# Patient Record
Sex: Female | Born: 1955 | Race: Black or African American | Hispanic: No | Marital: Single | Smoking: Never smoker
Health system: Southern US, Community
[De-identification: ages and names within clinical notes are randomized; demographics above are authoritative.]

## PROBLEM LIST (undated history)

## (undated) DIAGNOSIS — I10 Essential (primary) hypertension: Secondary | ICD-10-CM

## (undated) DIAGNOSIS — E039 Hypothyroidism, unspecified: Secondary | ICD-10-CM

## (undated) HISTORY — PX: ABDOMINAL HYSTERECTOMY: SHX81

---

## 2011-11-17 ENCOUNTER — Ambulatory Visit: Payer: Self-pay | Admitting: Family Medicine

## 2014-06-25 ENCOUNTER — Ambulatory Visit: Payer: Self-pay

## 2014-07-29 ENCOUNTER — Ambulatory Visit: Payer: Self-pay

## 2016-01-06 ENCOUNTER — Ambulatory Visit: Payer: Self-pay | Attending: Oncology

## 2017-10-16 ENCOUNTER — Other Ambulatory Visit: Payer: Self-pay | Admitting: Family Medicine

## 2017-10-16 DIAGNOSIS — Z1231 Encounter for screening mammogram for malignant neoplasm of breast: Secondary | ICD-10-CM

## 2017-12-02 ENCOUNTER — Ambulatory Visit
Admission: EM | Admit: 2017-12-02 | Discharge: 2017-12-02 | Disposition: A | Discharge status: Home / Self Care | Payer: BC Managed Care – PPO | Attending: Emergency Medicine | Admitting: Emergency Medicine

## 2017-12-02 ENCOUNTER — Other Ambulatory Visit: Payer: Self-pay

## 2017-12-02 DIAGNOSIS — S7012XA Contusion of left thigh, initial encounter: Secondary | ICD-10-CM | POA: Diagnosis not present

## 2017-12-02 HISTORY — DX: Essential (primary) hypertension: I10

## 2017-12-02 HISTORY — DX: Hypothyroidism, unspecified: E03.9

## 2017-12-02 MED ORDER — IBUPROFEN 600 MG PO TABS: 600.0000 mg | ORAL_TABLET | Freq: Four times a day (QID) | ORAL | 0 refills | Status: AC | PRN

## 2017-12-02 MED ORDER — PREDNISONE 10 MG (21) PO TBPK: ORAL_TABLET | ORAL | 0 refills | Status: AC
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
## 2017-12-02 NOTE — Discharge Instructions (Signed)
prednisone 6-day taper, ibuprofen 600 mg take with 1 g of Tylenol 2-4 times a day as needed for pain, ice for 20 minutes at a time.
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
## 2018-04-13 ENCOUNTER — Other Ambulatory Visit: Payer: Self-pay | Admitting: Family Medicine

## 2018-04-13 DIAGNOSIS — R2241 Localized swelling, mass and lump, right lower limb: Secondary | ICD-10-CM

## 2018-04-20 ENCOUNTER — Encounter
Admission: RE | Admit: 2018-04-20 | Discharge: 2018-04-20 | Disposition: A | Discharge status: Home / Self Care | Payer: BC Managed Care – PPO | Source: Ambulatory Visit | Attending: Family Medicine | Admitting: Family Medicine

## 2018-04-20 DIAGNOSIS — R2241 Localized swelling, mass and lump, right lower limb: Secondary | ICD-10-CM | POA: Insufficient documentation

## 2018-07-02 ENCOUNTER — Ambulatory Visit
Admission: RE | Admit: 2018-07-02 | Discharge: 2018-07-02 | Disposition: A | Discharge status: Home / Self Care | Payer: BC Managed Care – PPO | Source: Ambulatory Visit | Attending: Family Medicine | Admitting: Family Medicine

## 2018-07-02 DIAGNOSIS — Z1231 Encounter for screening mammogram for malignant neoplasm of breast: Secondary | ICD-10-CM | POA: Insufficient documentation

## 2019-03-29 ENCOUNTER — Other Ambulatory Visit: Payer: Self-pay

## 2019-03-29 DIAGNOSIS — Z20822 Contact with and (suspected) exposure to covid-19: Secondary | ICD-10-CM

## 2019-03-30 LAB — NOVEL CORONAVIRUS, NAA: SARS-CoV-2, NAA: NOT DETECTED

## 2019-04-05 ENCOUNTER — Other Ambulatory Visit: Payer: Self-pay | Admitting: Family Medicine

## 2019-04-05 DIAGNOSIS — Z20822 Contact with and (suspected) exposure to covid-19: Secondary | ICD-10-CM

## 2019-04-07 LAB — NOVEL CORONAVIRUS, NAA: SARS-CoV-2, NAA: NOT DETECTED

## 2019-04-07 NOTE — Telephone Encounter (Signed)
This encounter was created in error - please disregard.

## 2020-08-03 ENCOUNTER — Other Ambulatory Visit: Payer: Self-pay | Admitting: Family Medicine

## 2020-08-03 DIAGNOSIS — Z1231 Encounter for screening mammogram for malignant neoplasm of breast: Secondary | ICD-10-CM

## 2020-11-04 ENCOUNTER — Ambulatory Visit: Payer: BC Managed Care – PPO

## 2020-11-05 ENCOUNTER — Other Ambulatory Visit: Payer: Self-pay

## 2020-11-05 ENCOUNTER — Ambulatory Visit
Admission: RE | Admit: 2020-11-05 | Discharge: 2020-11-05 | Disposition: A | Discharge status: Home / Self Care | Payer: BC Managed Care – PPO | Source: Ambulatory Visit | Attending: Family Medicine | Admitting: Family Medicine

## 2020-11-05 DIAGNOSIS — Z1231 Encounter for screening mammogram for malignant neoplasm of breast: Secondary | ICD-10-CM | POA: Diagnosis not present

## 2021-10-15 ENCOUNTER — Other Ambulatory Visit: Payer: Self-pay | Admitting: Family Medicine

## 2021-10-15 DIAGNOSIS — Z1231 Encounter for screening mammogram for malignant neoplasm of breast: Secondary | ICD-10-CM

## 2021-11-26 ENCOUNTER — Ambulatory Visit
Admission: RE | Admit: 2021-11-26 | Discharge: 2021-11-26 | Disposition: A | Discharge status: Home / Self Care | Payer: BC Managed Care – PPO | Source: Ambulatory Visit | Attending: Family Medicine | Admitting: Family Medicine

## 2021-11-26 ENCOUNTER — Other Ambulatory Visit: Payer: Self-pay

## 2021-11-26 DIAGNOSIS — Z1231 Encounter for screening mammogram for malignant neoplasm of breast: Secondary | ICD-10-CM | POA: Diagnosis present

## 2022-03-11 ENCOUNTER — Encounter: Payer: BC Managed Care – PPO | Admitting: Obstetrics and Gynecology

## 2022-05-20 ENCOUNTER — Encounter: Payer: BC Managed Care – PPO | Admitting: Obstetrics and Gynecology

## 2022-11-29 ENCOUNTER — Other Ambulatory Visit: Payer: Self-pay | Admitting: Family Medicine

## 2022-11-29 DIAGNOSIS — Z1231 Encounter for screening mammogram for malignant neoplasm of breast: Secondary | ICD-10-CM

## 2023-02-08 ENCOUNTER — Ambulatory Visit: Payer: BC Managed Care – PPO

## 2023-02-08 ENCOUNTER — Ambulatory Visit
Admission: RE | Admit: 2023-02-08 | Discharge: 2023-02-08 | Disposition: A | Discharge status: Home / Self Care | Payer: BC Managed Care – PPO | Source: Ambulatory Visit | Attending: Family Medicine | Admitting: Family Medicine

## 2023-02-08 DIAGNOSIS — Z1231 Encounter for screening mammogram for malignant neoplasm of breast: Secondary | ICD-10-CM

## 2023-11-17 ENCOUNTER — Other Ambulatory Visit: Payer: Self-pay | Admitting: Nurse Practitioner

## 2023-11-17 DIAGNOSIS — Z780 Asymptomatic menopausal state: Secondary | ICD-10-CM

## 2024-02-16 IMAGING — MG MM DIGITAL SCREENING BILAT W/ TOMO AND CAD
6 of 10 series · 6 of 30 positions shown · non-contrast
Comparison: Previous exam(s).

CLINICAL DATA: Screening.

EXAM:
DIGITAL SCREENING BILATERAL MAMMOGRAM WITH TOMOSYNTHESIS AND CAD
TECHNIQUE: Bilateral screening digital craniocaudal and mediolateral oblique
mammograms were obtained. Bilateral screening digital breast
tomosynthesis was performed. The images were evaluated with
computer-aided detection.

[R CC synth-2D (1 of 2)]
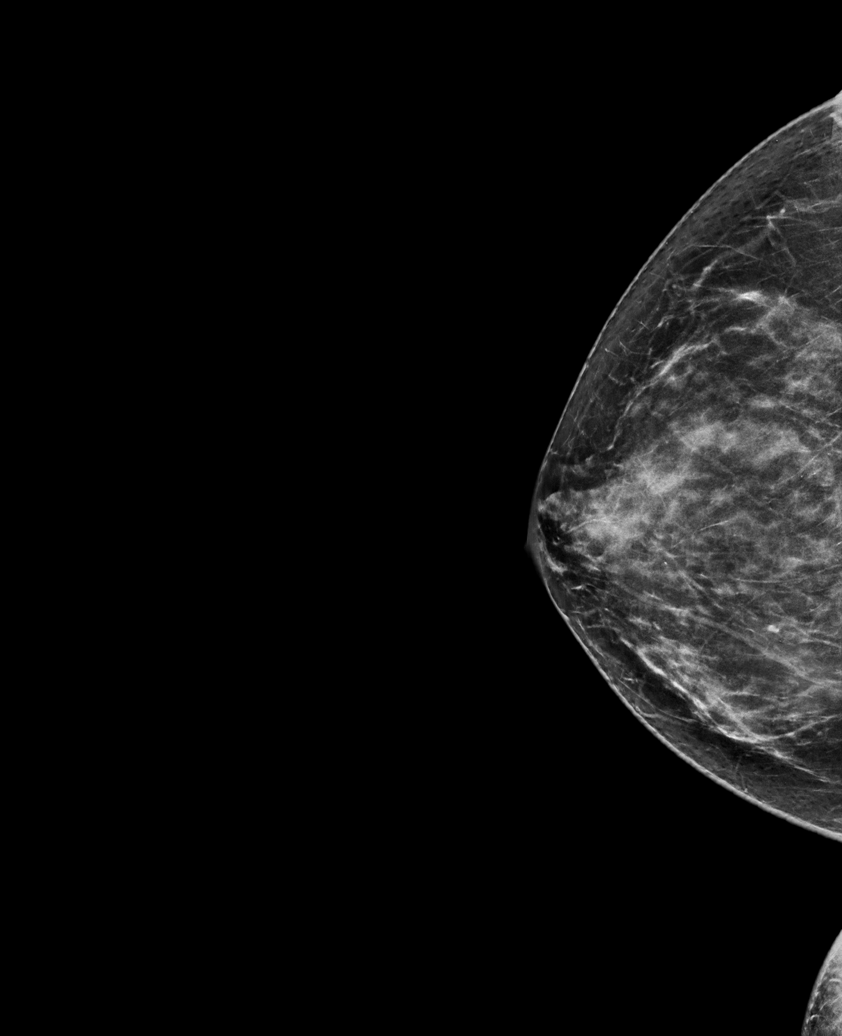

[L MLO synth-2D]
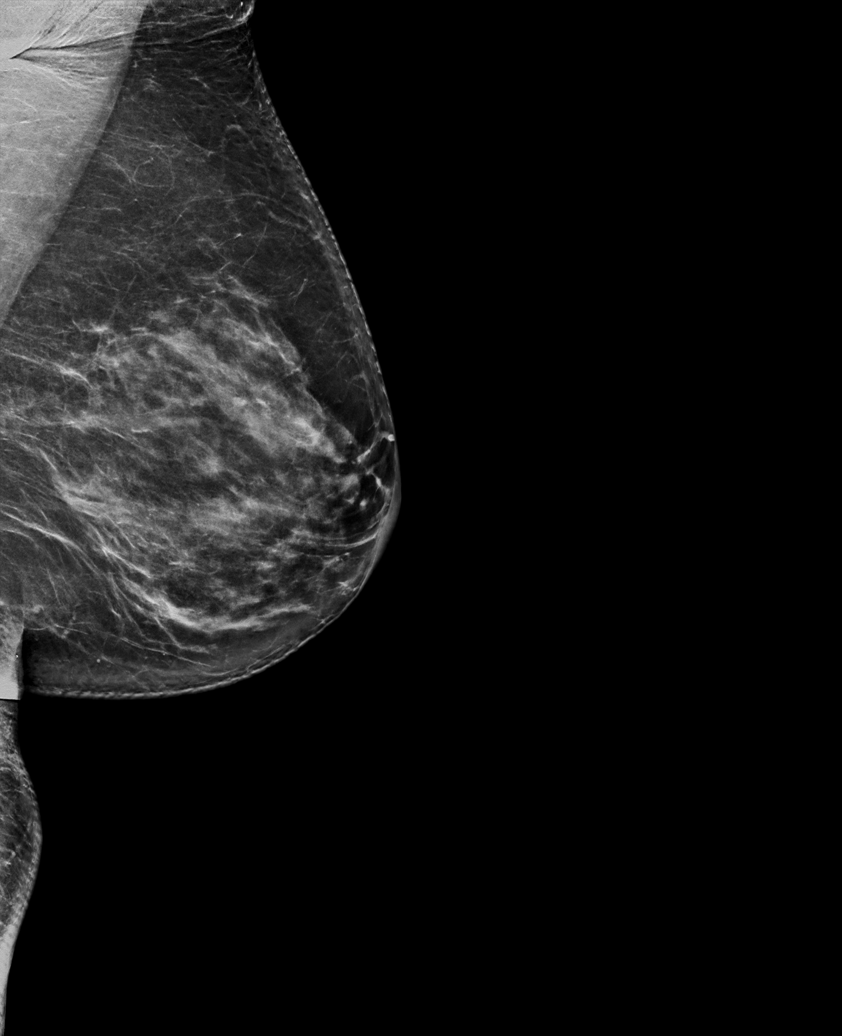

[L CC synth-2D]
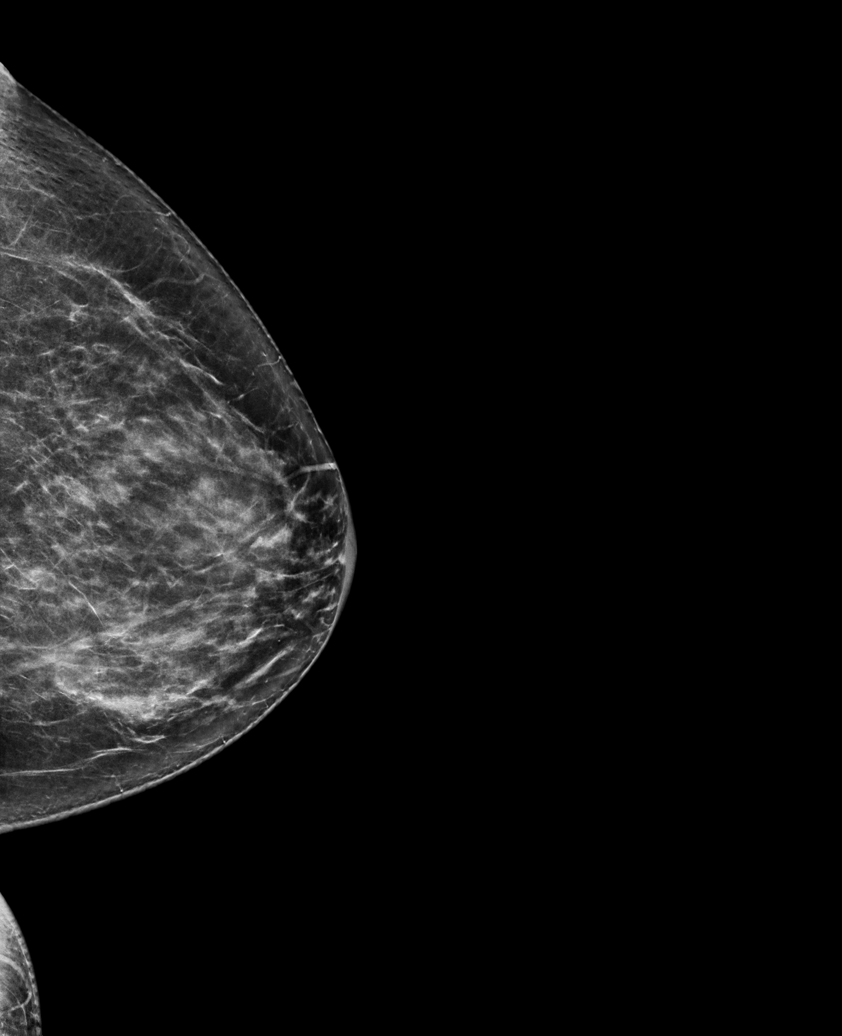

[R CC synth-2D (2 of 2)]
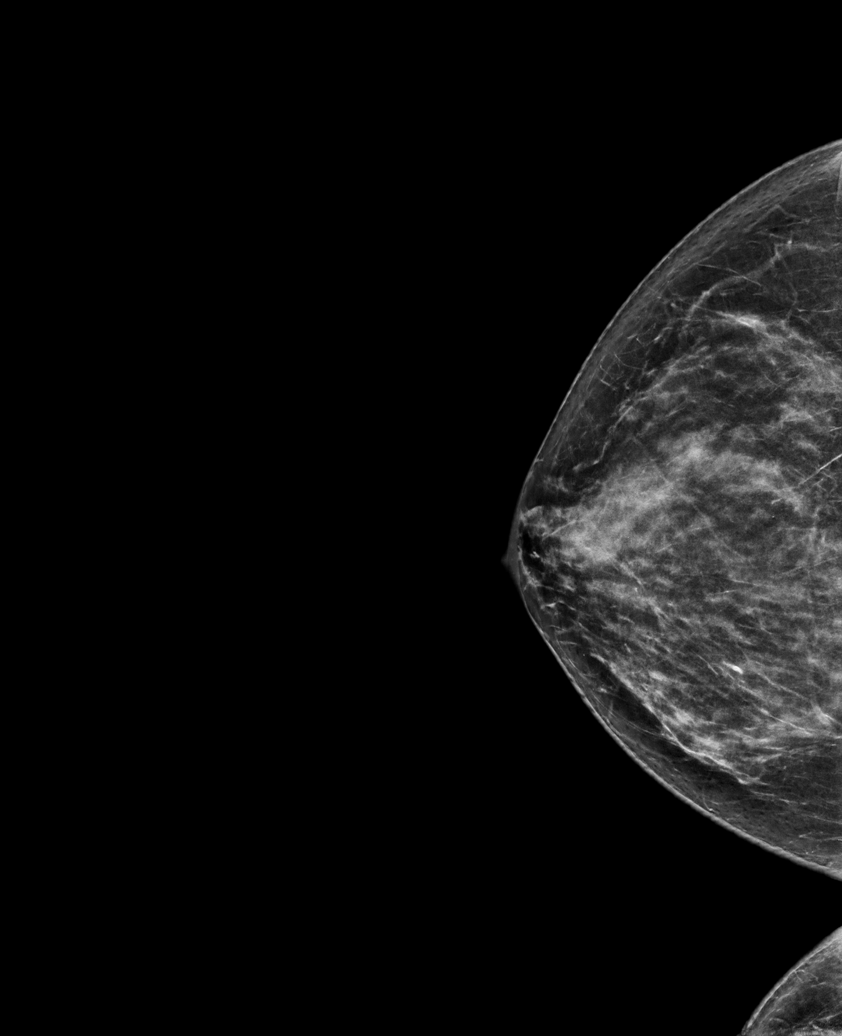

[R MLO synth-2D]
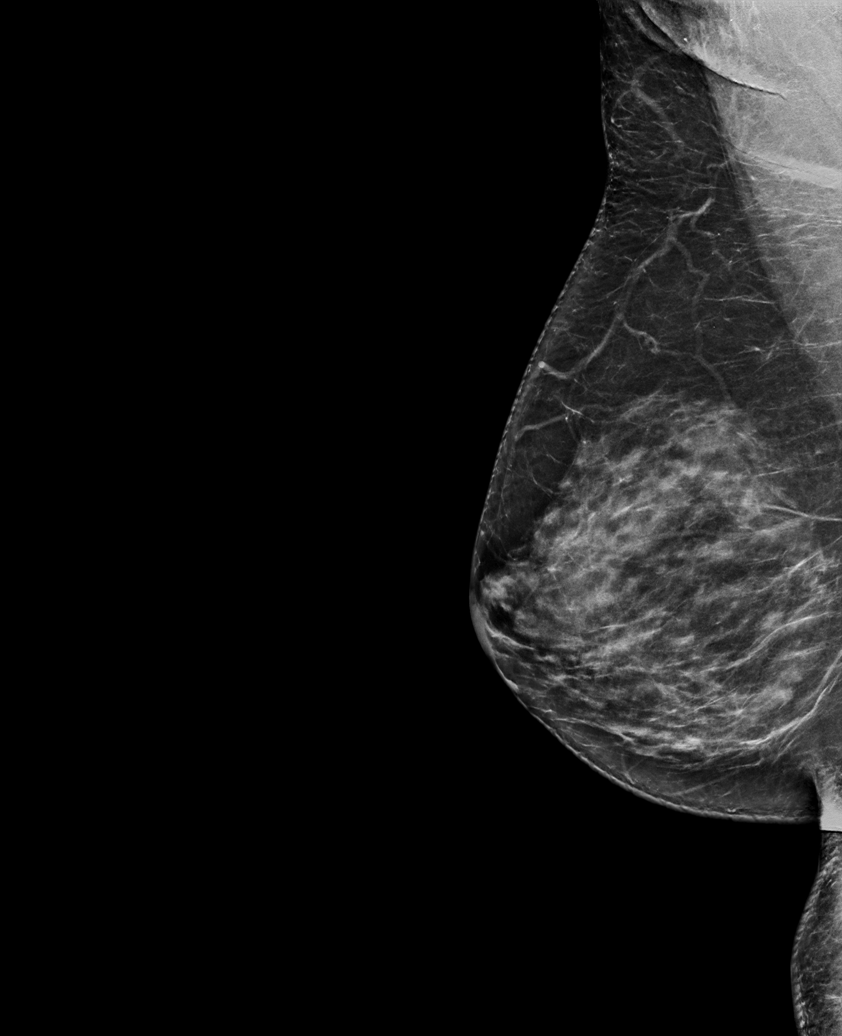

[L CC tomo · tomo slice 37/72.0]
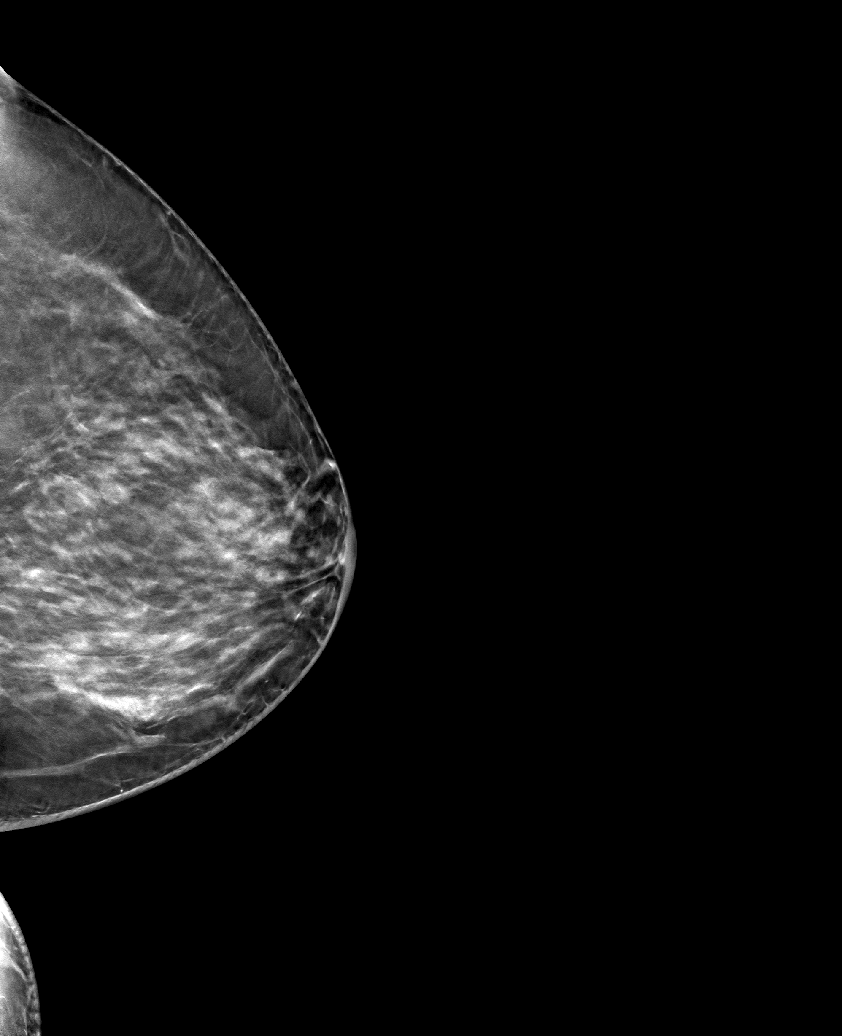

[6 of 30 positions shown; findings below may reference images not displayed]

ACR Breast Density Category c: The breast tissue is heterogeneously
dense, which may obscure small masses.
FINDINGS: There are no findings suspicious for malignancy.
IMPRESSION: No mammographic evidence of malignancy. A result letter of this
screening mammogram will be mailed directly to the patient.

RECOMMENDATION:
Screening mammogram in one year. (Code:Q3-W-BC3)

BI-RADS CATEGORY  1: Negative.

## 2024-03-07 ENCOUNTER — Other Ambulatory Visit: Payer: Self-pay

## 2024-03-07 DIAGNOSIS — Z1231 Encounter for screening mammogram for malignant neoplasm of breast: Secondary | ICD-10-CM
